# Patient Record
Sex: Male | Born: 1980 | Race: White | Hispanic: No | Marital: Single | Smoking: Current every day smoker
Health system: Southern US, Community
[De-identification: ages and names within clinical notes are randomized; demographics above are authoritative.]

## PROBLEM LIST (undated history)

## (undated) ENCOUNTER — Emergency Department (HOSPITAL_COMMUNITY): Payer: BLUE CROSS/BLUE SHIELD | Source: Home / Self Care

## (undated) DIAGNOSIS — J302 Other seasonal allergic rhinitis: Secondary | ICD-10-CM

---

## 2007-12-14 ENCOUNTER — Emergency Department (HOSPITAL_COMMUNITY): Admission: EM | Admit: 2007-12-14 | Discharge: 2007-12-14 | Payer: Self-pay | Admitting: Emergency Medicine

## 2009-03-24 ENCOUNTER — Emergency Department (HOSPITAL_COMMUNITY): Admission: EM | Admit: 2009-03-24 | Discharge: 2009-03-24 | Payer: Self-pay | Admitting: Emergency Medicine

## 2009-08-25 ENCOUNTER — Emergency Department (HOSPITAL_COMMUNITY): Admission: EM | Admit: 2009-08-25 | Discharge: 2009-08-25 | Payer: Self-pay | Admitting: Emergency Medicine

## 2010-08-15 ENCOUNTER — Encounter: Payer: Self-pay | Admitting: Orthopedic Surgery

## 2010-08-15 ENCOUNTER — Emergency Department (HOSPITAL_COMMUNITY): Admission: EM | Admit: 2010-08-15 | Discharge: 2010-08-15 | Payer: Self-pay | Admitting: Emergency Medicine

## 2010-08-20 ENCOUNTER — Ambulatory Visit: Payer: Self-pay | Admitting: Orthopedic Surgery

## 2010-08-20 DIAGNOSIS — M24469 Recurrent dislocation, unspecified knee: Secondary | ICD-10-CM

## 2010-08-20 DIAGNOSIS — S83006A Unspecified dislocation of unspecified patella, initial encounter: Secondary | ICD-10-CM

## 2010-09-10 ENCOUNTER — Ambulatory Visit: Payer: Self-pay | Admitting: Orthopedic Surgery

## 2010-09-10 ENCOUNTER — Encounter (INDEPENDENT_AMBULATORY_CARE_PROVIDER_SITE_OTHER): Payer: Self-pay | Admitting: *Deleted

## 2010-09-14 ENCOUNTER — Inpatient Hospital Stay (HOSPITAL_COMMUNITY): Admission: EM | Admit: 2010-09-14 | Discharge: 2010-09-19 | Payer: Self-pay | Admitting: Emergency Medicine

## 2010-10-08 ENCOUNTER — Emergency Department (HOSPITAL_COMMUNITY): Admission: EM | Admit: 2010-10-08 | Discharge: 2010-09-13 | Payer: Self-pay | Admitting: Emergency Medicine

## 2010-12-01 NOTE — Assessment & Plan Note (Signed)
Summary: ER/lt knee pain/xrays ap/aware to pay $100/frs   Vital Signs:  Patient profile:   30 year old male Height:      72 inches Weight:      196 pounds Pulse rate:   80 / minute Resp:     18 per minute  Vitals Entered By: Fuller Canada MD (August 20, 2010 9:29 AM)  Visit Type:  new patient Referring Provider:  ap er Primary Provider:  Dr. Renard Matter  CC:  left knee pain.  History of Present Illness: I saw Mason Green in the office today for an initial visit.  He is a 30 years old man with the complaint KG:MWNU knee pain.  DOI 08/14/10 was moving a box and knee dislocated, he put back in place.  Had 1 dislocation beforr this  12 yrs ago playing football, the knee went back in place itself.  Xrays [facility] 08/15/10 left knee.  Meds: Xanax, Lortab7.5/500 1 by mouth q 4 hrs as needed pain number 24 given from er, Dexamethasone 4mg , number 7 given from er.  This 30 her old male complains of sharp dull throbbing stabbing pain in his LEFT knee which started on October 14 after moving a large box.  He said his knee Dislocated and he put it back in place.  This is a second traumatic dislocation for this patient.  12 years ago during a football game he had the same situation and it was reduced on the field they were impacted playing later this season  He complains of 6/10 constant pain.  His symptoms are improved ice worse with walking.  He did have some symptoms of numbness and tingling with swelling.  He is currently on Lortab 7.5 with dexamethasone which he received from the ER along with an Ace wrap and a knee immobilizer.        Allergies (verified): 1)  ! Jonne Ply  Past History:  Past Medical History: anxiety  Past Surgical History: wisdom teeth  Family History: FH of Cancer:  Family History of Diabetes  Social History: Patient is single.  Patient is divorced.  smokes 1ppd cigs no alcohol no caffeine 11th grade ed.  Review of Systems Constitutional:  Denies weight  loss, weight gain, fever, chills, and fatigue. Cardiovascular:  Denies chest pain, palpitations, fainting, and murmurs. Respiratory:  Denies short of breath, wheezing, couch, tightness, pain on inspiration, and snoring . Gastrointestinal:  Denies heartburn, nausea, vomiting, diarrhea, constipation, and blood in your stools. Genitourinary:  Denies frequency, urgency, difficulty urinating, painful urination, flank pain, and bleeding in urine. Neurologic:  Complains of numbness and tingling; denies unsteady gait, dizziness, tremors, and seizure. Musculoskeletal:  Complains of joint pain, swelling, stiffness, and muscle pain; denies instability, redness, and heat. Endocrine:  Denies excessive thirst, exessive urination, and heat or cold intolerance. Psychiatric:  Denies nervousness, depression, anxiety, and hallucinations. Skin:  Denies changes in the skin, poor healing, rash, itching, and redness. HEENT:  Denies blurred or double vision, eye pain, redness, and watering. Immunology:  Complains of seasonal allergies; denies sinus problems and allergic to bee stings. Hemoatologic:  Denies easy bleeding and brusing.  Physical Exam  Additional Exam:  GEN: well developed, well nourished, normal grooming and hygiene, no deformity and normal body habitus.   CDV: pulses are normal, no edema, no erythema. no tenderness  Lymph: normal lymph nodes   Skin: no rashes, skin lesions or open sores   NEURO: normal coordination, reflexes, sensation.   Psyche: awake, alert and oriented. Mood normal  Gait:  is abnormal with a limp crutches and a brace  Chest tenderness on the medial side of the knee in the peripatellar structures and medial patellofemoral ligament.  He has limited range of motion of 30.  His motor exam is deferred but has good muscle tone  His patella is stable but he has apprehension his ACL was intact PCL is intact his collateral ligaments are stable      Impression &  Recommendations:  Problem # 1:  PATELLAR DISLOCATION, LEFT (ICD-836.3) Assessment New  x-rays are recorded on the report and reviewed and I agree with the report small tiny avulsion possibly from the lateral femoral condyle does not appear to be a loose body  Recommendation for 3 weeks  Return for reevaluation and we will order a brace for him when he comes back he'll have to pick it up from the [place] [facility] because he has Medicaid or he is uninsured.  Orders: New Patient Level III (16109)  Patient Instructions: 1)  Wear brace for 3 weeks, only take off when bathing 2)  Return in 3 weeks  3)  walk as tolerated    Orders Added: 1)  New Patient Level III [60454]

## 2010-12-01 NOTE — Letter (Signed)
Summary: History form  History form   Imported By: Jacklynn Ganong 08/26/2010 15:05:41  _____________________________________________________________________  External Attachment:    Type:   Image     Comment:   External Document

## 2010-12-01 NOTE — Letter (Signed)
Summary: Out of Work  [facility]  [address]  [place], [place] [address]   Phone: [phone]  Fax: [phone]    September 10, 2010   Employee:  GOBLE FUDALA Racey    To Whom It May Concern:   For Medical reasons, please excuse the above named employee from work for the following dates:  Start:   08/20/2010  End:/   Cleared to return to work full duty no restrictions 09/10/2010    If you need additional information, please feel free to contact our office.         Sincerely,    Terrance Mass, MD

## 2010-12-01 NOTE — Assessment & Plan Note (Signed)
Summary: 3 WK RE-CK LT KNEE/SELF PAY/?[facility] DISCOUNT/CAF   Visit Type:  Follow-up Referring Provider:  ap er Primary Provider:  Dr. Renard Matter  CC:  left knee pain.  History of Present Illness: QI:[id number] LEFT patella subluxation/dislocation.  Treatment bracing.  Medication Lortab 7.5/500 one q.4 p.r.n. for pain. Meds: Xanax, Lortab7.5/500 1 by mouth q 4 hrs as needed pain number 24 given from er, Dexamethasone 4mg , number 7 given from er.  Complaints popping some stiffness. he also has some pain at night when his knee straight.  Patient brought himself a different brace with a hinge and an open patella.  In today for followup.  DOI 08/14/10 was moving a box and knee dislocated, he put back in place.  Had 1 dislocation before this  12 yrs ago playing football, the knee went back in place itself.  Xrays [facility] 08/15/10 left knee.   Allergies: 1)  ! Asa   Knee Exam  General:    Well-developed, well-nourished, normal body habitus; no deformities, normal grooming.  Gait:    Normal heel-toe gait pattern bilaterally.    Skin:    Intact, no scars, lesions, rashes, cafe au lait spots, or bruising.    Vascular:    There was no swelling or varicose veins. The pulses and temperature are normal. There was no edema or tenderness.  Sensory:    Gross coordination and sensation were normal.    Motor:    he does have some mild extension, weakness, minimal lag.  Knee Exam:    Left:    Inspection/Palpation:  patellofemoral joint is stable at 20, flexion. Minimal apprehension. Range of motion is returned to normal and no joint effusion   Impression & Recommendations:  Problem # 1:  PATELLAR DISLOCATION, LEFT (ICD-836.3) Assessment Improved  Orders: Est. Patient Level III (13244)  Problem # 2:  SUBLUXATION PATELLAR (MALALIGNMENT) (ICD-718.36) Assessment: Improved  Orders: Est. Patient Level III (01027)  Medications Added to Medication List This Visit: 1)  Norco 5-325 Mg  Tabs (Hydrocodone-acetaminophen) .Marland Kitchen.. 1 by mouth q 6 as needed pain  Patient Instructions: 1)  Wear brace for 6 weeks. 2)  Do exercises for 6 weeks  3)  Please schedule a follow-up appointment as needed. Prescriptions: NORCO 5-325 MG TABS (HYDROCODONE-ACETAMINOPHEN) 1 by mouth q 6 as needed pain  #56 x 1   Entered and Authorized by:   Fuller Canada MD   Signed by:   Fuller Canada MD on 09/10/2010   Method used:   Print then Give to Patient   RxID:   [id number]    Orders Added: 1)  Est. Patient Level III [63875]

## 2011-01-12 LAB — DIFFERENTIAL
Basophils Absolute: 0 10*3/uL (ref 0.0–0.1)
Basophils Absolute: 0 10*3/uL (ref 0.0–0.1)
Basophils Relative: 0 % (ref 0–1)
Basophils Relative: 0 % (ref 0–1)
Basophils Relative: 0 % (ref 0–1)
Basophils Relative: 0 % (ref 0–1)
Eosinophils Absolute: 0.3 10*3/uL (ref 0.0–0.7)
Eosinophils Absolute: 0.5 10*3/uL (ref 0.0–0.7)
Eosinophils Absolute: 0.6 10*3/uL (ref 0.0–0.7)
Eosinophils Relative: 2 % (ref 0–5)
Eosinophils Relative: 2 % (ref 0–5)
Eosinophils Relative: 3 % (ref 0–5)
Lymphocytes Relative: 10 % — ABNORMAL LOW (ref 12–46)
Lymphocytes Relative: 11 % — ABNORMAL LOW (ref 12–46)
Lymphocytes Relative: 8 % — ABNORMAL LOW (ref 12–46)
Lymphs Abs: 2 10*3/uL (ref 0.7–4.0)
Lymphs Abs: 2.2 10*3/uL (ref 0.7–4.0)
Lymphs Abs: 2.8 10*3/uL (ref 0.7–4.0)
Monocytes Absolute: 2 10*3/uL — ABNORMAL HIGH (ref 0.1–1.0)
Monocytes Absolute: 2.1 10*3/uL — ABNORMAL HIGH (ref 0.1–1.0)
Monocytes Relative: 10 % (ref 3–12)
Monocytes Relative: 4 % (ref 3–12)
Neutro Abs: 16.1 10*3/uL — ABNORMAL HIGH (ref 1.7–7.7)
Neutro Abs: 21.1 10*3/uL — ABNORMAL HIGH (ref 1.7–7.7)
Neutrophils Relative %: 76 % (ref 43–77)
Neutrophils Relative %: 82 % — ABNORMAL HIGH (ref 43–77)
Neutrophils Relative %: 83 % — ABNORMAL HIGH (ref 43–77)
Neutrophils Relative %: 84 % — ABNORMAL HIGH (ref 43–77)

## 2011-01-12 LAB — CBC
HCT: 35.5 % — ABNORMAL LOW (ref 39.0–52.0)
HCT: 36.4 % — ABNORMAL LOW (ref 39.0–52.0)
HCT: 42.1 % (ref 39.0–52.0)
Hemoglobin: 12 g/dL — ABNORMAL LOW (ref 13.0–17.0)
Hemoglobin: 12.8 g/dL — ABNORMAL LOW (ref 13.0–17.0)
Hemoglobin: 14.5 g/dL (ref 13.0–17.0)
MCH: 31 pg (ref 26.0–34.0)
MCH: 31.3 pg (ref 26.0–34.0)
MCH: 31.8 pg (ref 26.0–34.0)
MCH: 32 pg (ref 26.0–34.0)
MCHC: 33.1 g/dL (ref 30.0–36.0)
MCHC: 33.8 g/dL (ref 30.0–36.0)
MCHC: 34.5 g/dL (ref 30.0–36.0)
MCV: 92.8 fL (ref 78.0–100.0)
MCV: 93 fL (ref 78.0–100.0)
MCV: 93.7 fL (ref 78.0–100.0)
Platelets: 280 10*3/uL (ref 150–400)
Platelets: 387 10*3/uL (ref 150–400)
RBC: 3.34 MIL/uL — ABNORMAL LOW (ref 4.22–5.81)
RDW: 14.4 % (ref 11.5–15.5)
RDW: 14.7 % (ref 11.5–15.5)
RDW: 15 % (ref 11.5–15.5)
RDW: 15.1 % (ref 11.5–15.5)
RDW: 15.2 % (ref 11.5–15.5)
WBC: 18.3 10*3/uL — ABNORMAL HIGH (ref 4.0–10.5)
WBC: 21 10*3/uL — ABNORMAL HIGH (ref 4.0–10.5)
WBC: 28 10*3/uL — ABNORMAL HIGH (ref 4.0–10.5)

## 2011-01-12 LAB — COMPREHENSIVE METABOLIC PANEL
ALT: 17 U/L (ref 0–53)
ALT: 30 U/L (ref 0–53)
AST: 13 U/L (ref 0–37)
AST: 15 U/L (ref 0–37)
Albumin: 2.7 g/dL — ABNORMAL LOW (ref 3.5–5.2)
Alkaline Phosphatase: 72 U/L (ref 39–117)
BUN: 7 mg/dL (ref 6–23)
CO2: 24 mEq/L (ref 19–32)
CO2: 28 mEq/L (ref 19–32)
Calcium: 8.8 mg/dL (ref 8.4–10.5)
Calcium: 8.8 mg/dL (ref 8.4–10.5)
Creatinine, Ser: 0.89 mg/dL (ref 0.4–1.5)
Creatinine, Ser: 1.14 mg/dL (ref 0.4–1.5)
GFR calc Af Amer: >60 mL/min (ref >60)
GFR calc Af Amer: >60 mL/min (ref >60)
GFR calc non Af Amer: >60 mL/min (ref >60)
GFR calc non Af Amer: >60 mL/min (ref >60)
Glucose, Bld: 135 mg/dL — ABNORMAL HIGH (ref 70–99)
Glucose, Bld: 94 mg/dL (ref 70–99)
Potassium: 3.9 mEq/L (ref 3.5–5.1)
Sodium: 139 mEq/L (ref 135–145)
Total Protein: 6.4 g/dL (ref 6.0–8.3)
Total Protein: 7.4 g/dL (ref 6.0–8.3)

## 2011-01-12 LAB — BASIC METABOLIC PANEL
CO2: 26 mEq/L (ref 19–32)
Chloride: 102 mEq/L (ref 96–112)
Creatinine, Ser: 0.98 mg/dL (ref 0.4–1.5)
GFR calc Af Amer: >60 mL/min (ref >60)
GFR calc non Af Amer: >60 mL/min (ref >60)
Glucose, Bld: 93 mg/dL (ref 70–99)
Potassium: 3.9 mEq/L (ref 3.5–5.1)
Sodium: 138 mEq/L (ref 135–145)

## 2011-01-12 LAB — ANAEROBIC CULTURE

## 2011-01-12 LAB — CULTURE, BLOOD (ROUTINE X 2)

## 2011-01-12 LAB — WOUND CULTURE

## 2011-08-05 IMAGING — CT CT EXTREM LOW W/ CM*R*
3 of 5 series · 13 of 33 positions shown, 15 images · IV contrast (Omnipaque 300)
Comparison: None

CLINICAL DATA: Right anterior thigh infection with erythema and
swelling from the inguinal groove down to the knee.

CT OF THE RIGHT THIGH WITH CONTRAST
TECHNIQUE: Multidetector CT imaging of the right thigh was
performed according to the standard protocol following intravenous
contrast administration. Multiplanar CT image reconstructions were
also generated.
Contrast: 80 ml [id number]-VLL

[Series 4: extremity soft tissue 3.0 b30s · axial · 0.74mm/px · z∈[+142,+511]mm · 8 of 147 slices shown, 10 images]
[im 12/147  soft-tissue]
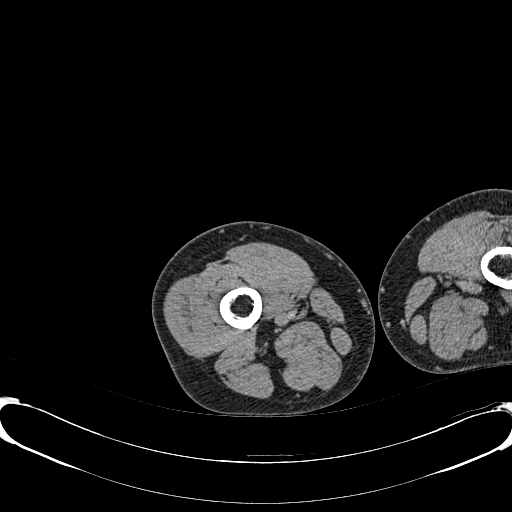
[im 12/147  bone]
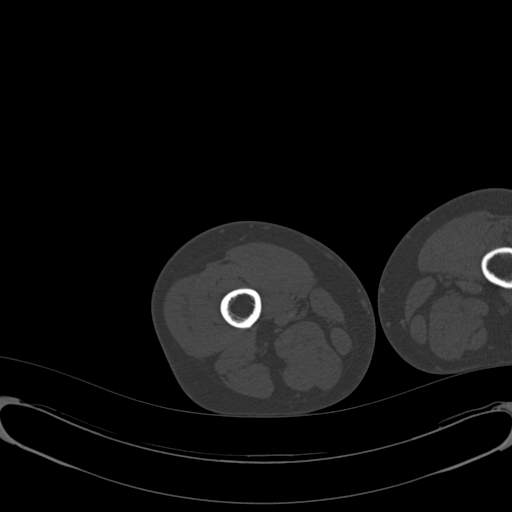
[im 34/147  bone]
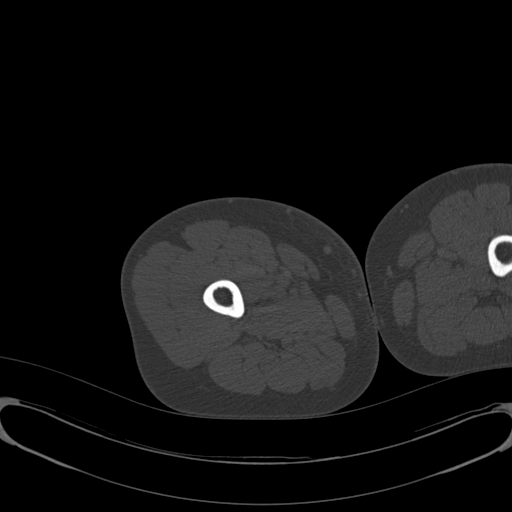
[im 45/147  bone]
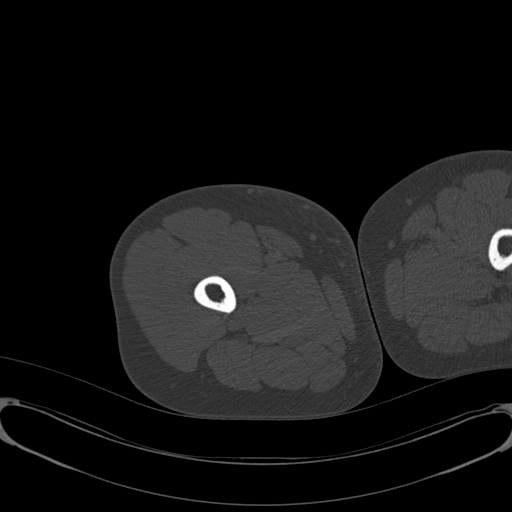
[im 68/147  bone]
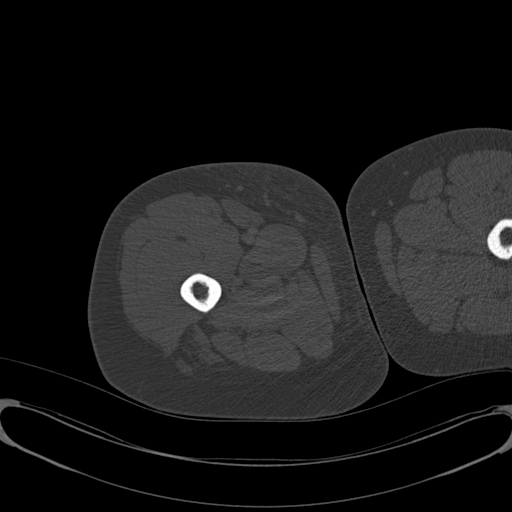
[im 79/147  soft-tissue]
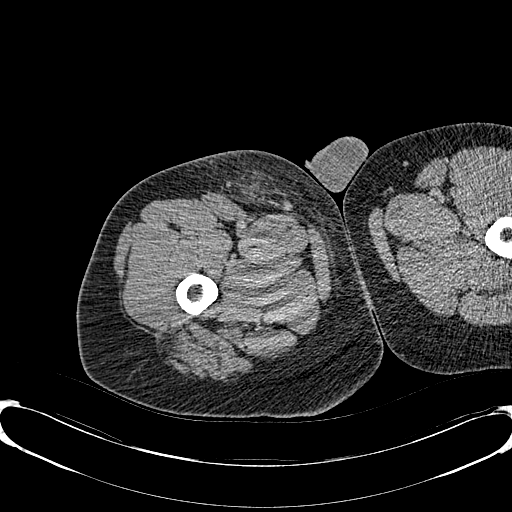
[im 79/147  bone]
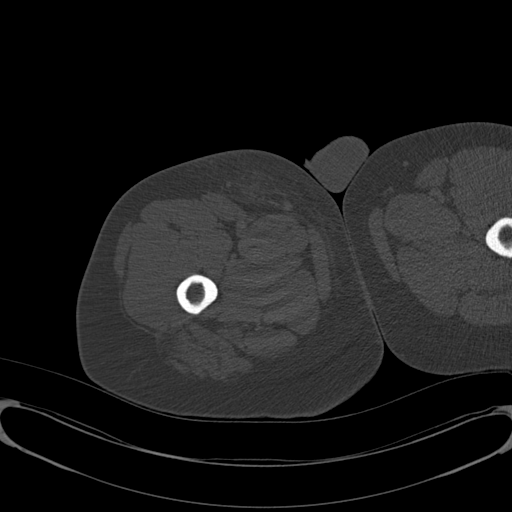
[im 102/147  bone]
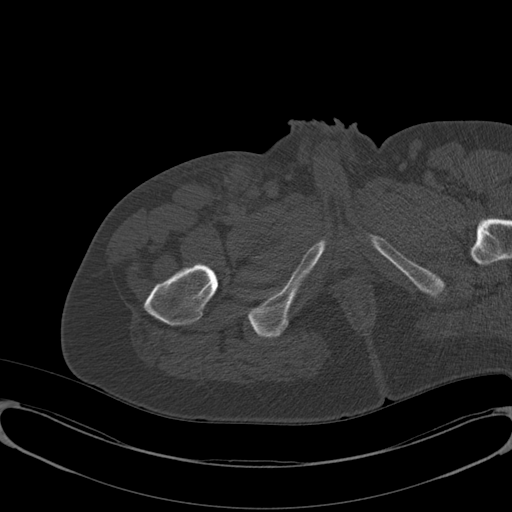
[im 113/147  bone]
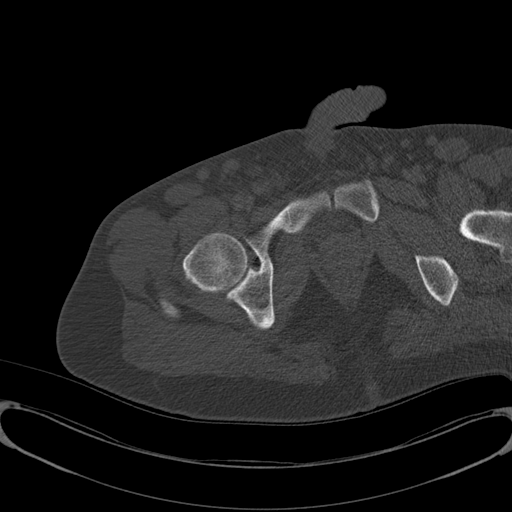
[im 135/147  bone]
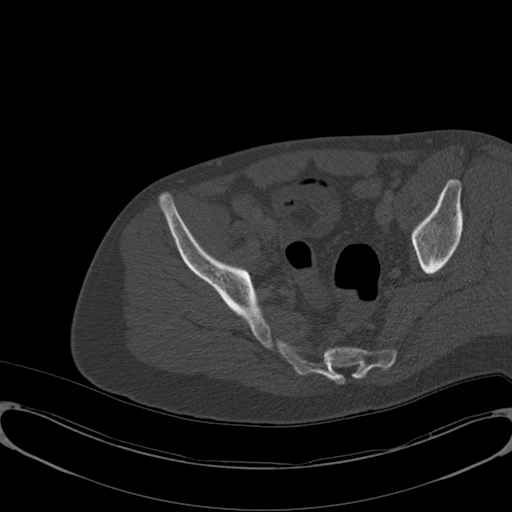

[Series 5: extremity 3.0 bone cor · coronal · 0.63mm/px · 1 of 84 slices shown]
[im 42/84  bone]
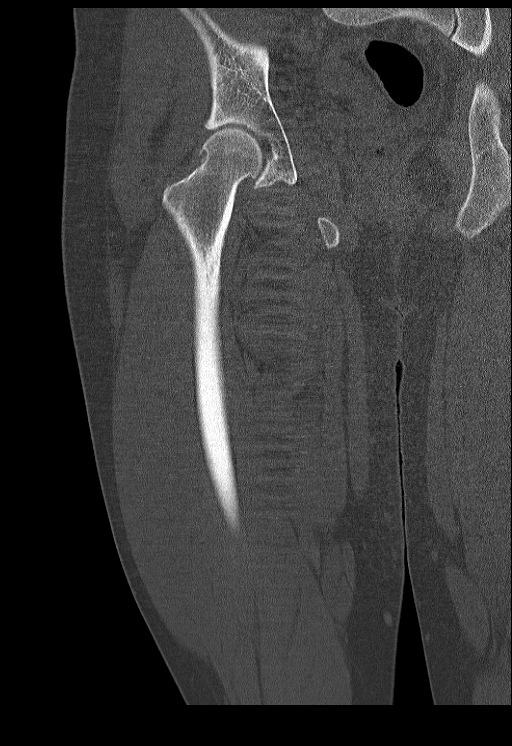

[Series 6: extremity 3.0 bone sag · sagittal · 0.53mm/px · 4 of 84 slices shown]
[im 17/84  bone]
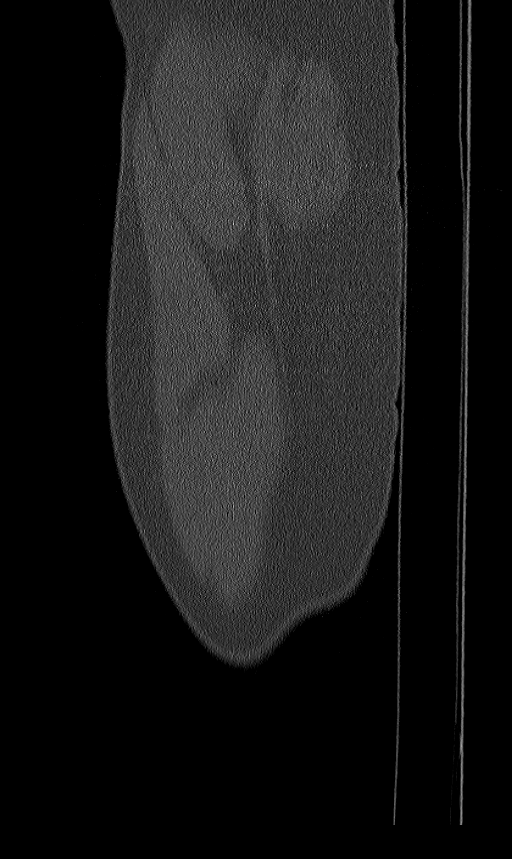
[im 34/84  bone]
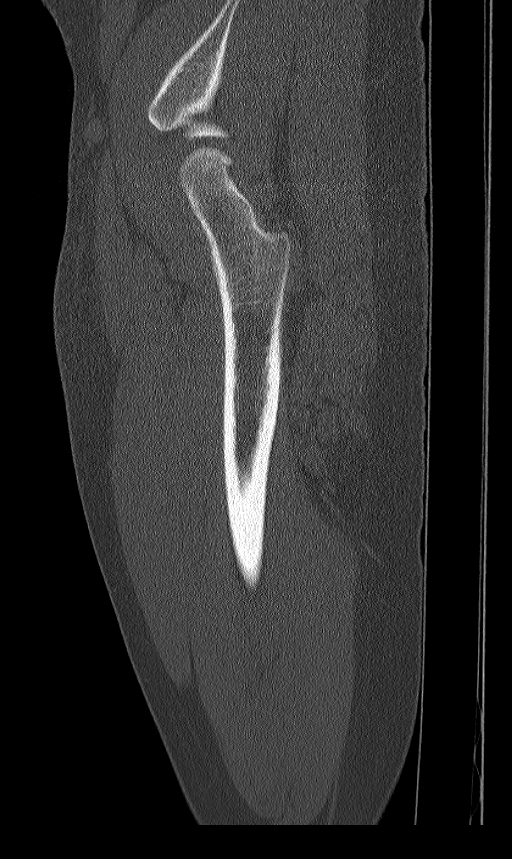
[im 50/84  bone]
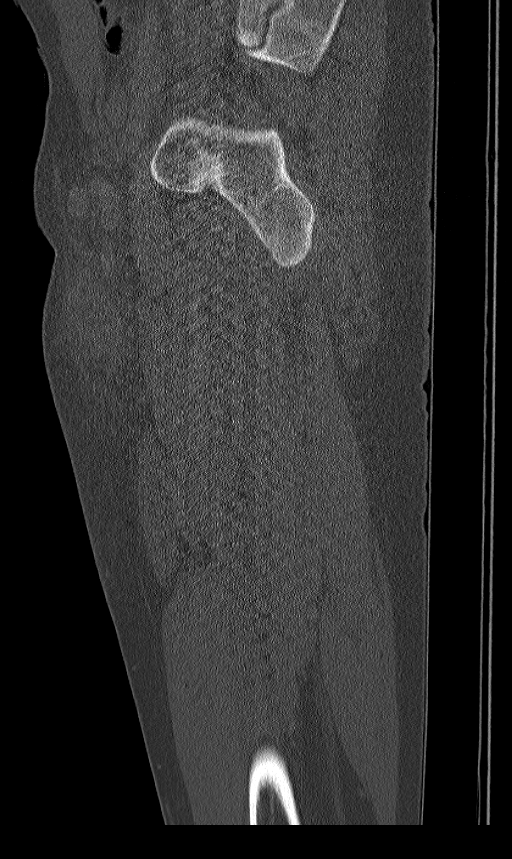
[im 67/84  bone]
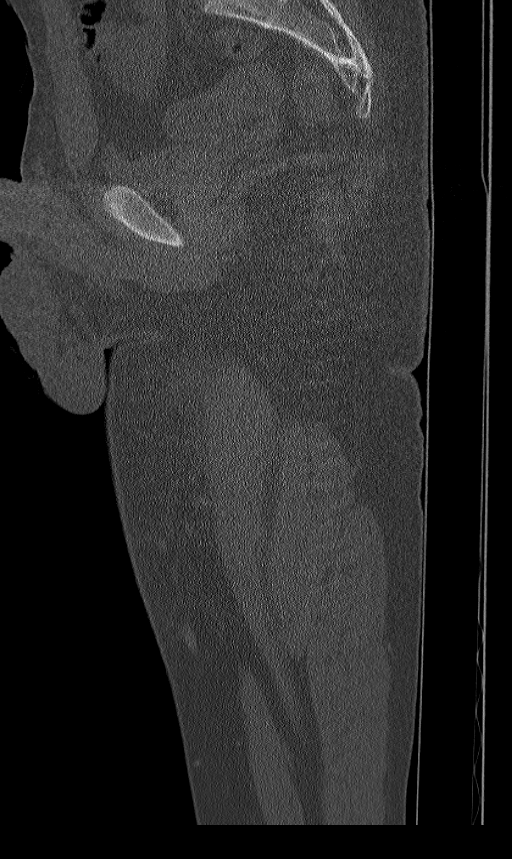

[13 of 33 positions shown; findings below may reference images not displayed]

FINDINGS: Mildly enlarged pelvic sidewall nodes are present.  A
right external iliac node measures 1.5 cm in short axis dimension
on image 22 of series 4.  A separate right external iliac node
measures 1.5 cm in short axis dimension on image 18 of series 4.

On image 59 of series 4, in the inguinal region on the right side,
a 4.0 x 3.3 x 6.0 cm enhancing lesion with some low density
portions probably represents an enlarged and inflamed inguinal
lymph node, and less likely a localized inflammatory phlegmon.
This currently does not represent a drainable abscess.

There is some mild stranding in the subcutaneous tissues around
this lesion and extending into the medial right thigh inferiorly,
although the degree of stranding is relatively mild.

No discernible inflammation of the regional muscle groups
identified.  The neurovascular structures in the area appear
unremarkable.

No findings of hip effusion or osteomyelitis.
IMPRESSION: 1.  Enhancing  structure in the right inguinal region most
compatible with enlarged and inflamed right inguinal lymph node,
with some low density portions suggesting areas of mild necrosis.
This most likely relates to an infectious process such as cat
scratch disease.  No drainable abscess is identified.  Inflammatory
phlegmon which does not include a lymph node is a differential
diagnostic consideration.  Enlarged right external iliac nodes are
present.
2.  No muscle group involvement or bony involvement is identified.
No hip effusion.  Inflammation from the right inguinal process
manifests as stranding which extends along the medial right thigh.
3.  If the patient has diabetes, this should be followed very
closely and treated more aggressively.

## 2013-04-10 ENCOUNTER — Emergency Department (HOSPITAL_COMMUNITY)
Admission: EM | Admit: 2013-04-10 | Discharge: 2013-04-10 | Disposition: A | Discharge status: Home / Self Care | Payer: Self-pay | Attending: Emergency Medicine | Admitting: Emergency Medicine

## 2013-04-10 ENCOUNTER — Emergency Department (HOSPITAL_COMMUNITY): Payer: Self-pay

## 2013-04-10 ENCOUNTER — Encounter (HOSPITAL_COMMUNITY): Payer: Self-pay | Admitting: *Deleted

## 2013-04-10 DIAGNOSIS — F17200 Nicotine dependence, unspecified, uncomplicated: Secondary | ICD-10-CM | POA: Insufficient documentation

## 2013-04-10 DIAGNOSIS — Y9389 Activity, other specified: Secondary | ICD-10-CM | POA: Insufficient documentation

## 2013-04-10 DIAGNOSIS — Y929 Unspecified place or not applicable: Secondary | ICD-10-CM | POA: Insufficient documentation

## 2013-04-10 DIAGNOSIS — X503XXA Overexertion from repetitive movements, initial encounter: Secondary | ICD-10-CM | POA: Insufficient documentation

## 2013-04-10 DIAGNOSIS — S20211A Contusion of right front wall of thorax, initial encounter: Secondary | ICD-10-CM

## 2013-04-10 DIAGNOSIS — S20219A Contusion of unspecified front wall of thorax, initial encounter: Secondary | ICD-10-CM | POA: Insufficient documentation

## 2013-04-10 MED ORDER — METHOCARBAMOL 750 MG PO TABS: 750.0000 mg | ORAL_TABLET | Freq: Four times a day (QID) | ORAL | Status: DC

## 2013-04-10 MED ORDER — OXYCODONE-ACETAMINOPHEN 5-325 MG PO TABS
2.0000 | ORAL_TABLET | Freq: Once | ORAL | Status: AC
  Administered 2013-04-10: 2 via ORAL
  Filled 2013-04-10: qty 2

## 2013-04-10 MED ORDER — OXYCODONE-ACETAMINOPHEN 5-325 MG PO TABS: 2.0000 | ORAL_TABLET | ORAL | Status: DC | PRN

## 2013-04-10 NOTE — ED Notes (Signed)
Pt states was lifting weight yesterday & felt something pop like a rubber band, states hurting in chest w/ movement.

## 2013-04-10 NOTE — ED Notes (Signed)
Pt alert & oriented x4, stable gait. Patient given discharge instructions, paperwork & prescription(s). Patient  instructed to stop at the registration desk to finish any additional paperwork. Patient verbalized understanding. Pt left department w/ no further questions. 

## 2013-04-10 NOTE — ED Provider Notes (Signed)
History     CSN: [id number]  Arrival date & time 04/10/13  2028   First MD Initiated Contact with Patient 04/10/13 2038      Chief Complaint  Patient presents with  . Chest Injury    (Consider location/radiation/quality/duration/timing/severity/associated sxs/prior treatment) The history is provided by the patient.   Patient here complaining of right-sided chest pain started when he was lifting weights. He was expressing it up is that pain is lateral right pectoralis muscle. Pain characterized as sharp and worse to take a deep breath. He denies any dyspnea. Took aspirin with some relief. Pain is better with rest and not moving History reviewed. No pertinent past medical history.  History reviewed. No pertinent past surgical history.  No family history on file.  History  Substance Use Topics  . Smoking status: Current Every Day Smoker    Types: Cigarettes  . Smokeless tobacco: Not on file  . Alcohol Use: No      Review of Systems  All other systems reviewed and are negative.    Allergies  Aspirin  Home Medications  No current outpatient prescriptions on file.  BP 137/77  Pulse 70  Temp(Src) 98.2 F (36.8 C) (Oral)  Resp 18  Ht 6' (1.829 m)  Wt 195 lb (88.451 kg)  BMI 26.44 kg/m2  SpO2 98%  Physical Exam  Nursing note and vitals reviewed. Constitutional: He is oriented to person, place, and time. He appears well-developed and well-nourished.  Non-toxic appearance. No distress.  HENT:  Head: Normocephalic and atraumatic.  Eyes: Conjunctivae, EOM and lids are normal. Pupils are equal, round, and reactive to light.  Neck: Normal range of motion. Neck supple. No tracheal deviation present. No mass present.  Cardiovascular: Normal rate, regular rhythm and normal heart sounds.  Exam reveals no gallop.   No murmur heard. Pulmonary/Chest: Effort normal and breath sounds normal. No stridor. No respiratory distress. He has no decreased breath sounds. He has no  wheezes. He has no rhonchi. He has no rales.    Abdominal: Soft. Normal appearance and bowel sounds are normal. He exhibits no distension. There is no tenderness. There is no rebound and no CVA tenderness.  Musculoskeletal: Normal range of motion. He exhibits no edema and no tenderness.  Neurological: He is alert and oriented to person, place, and time. He has normal strength. No cranial nerve deficit or sensory deficit. GCS eye subscore is 4. GCS verbal subscore is 5. GCS motor subscore is 6.  Skin: Skin is warm and dry. No abrasion and no rash noted.  Psychiatric: He has a normal mood and affect. His speech is normal and behavior is normal.    ED Course  Procedures (including critical care time)  Labs Reviewed - No data to display No results found.   No diagnosis found.    MDM  Pt with negaive cxr and likely chest wall muscle injury--given percocet here and pt feels better        Toy Baker, MD 04/12/13 1321

## 2014-03-02 IMAGING — CR DG CHEST 2V
2 series · 2 of 2 positions shown · non-contrast
Comparison: 12/14/2007

CLINICAL DATA: Acute right chest pain while lifting weights.

CHEST - 2 VIEW

[view not recorded (1 of 2)]
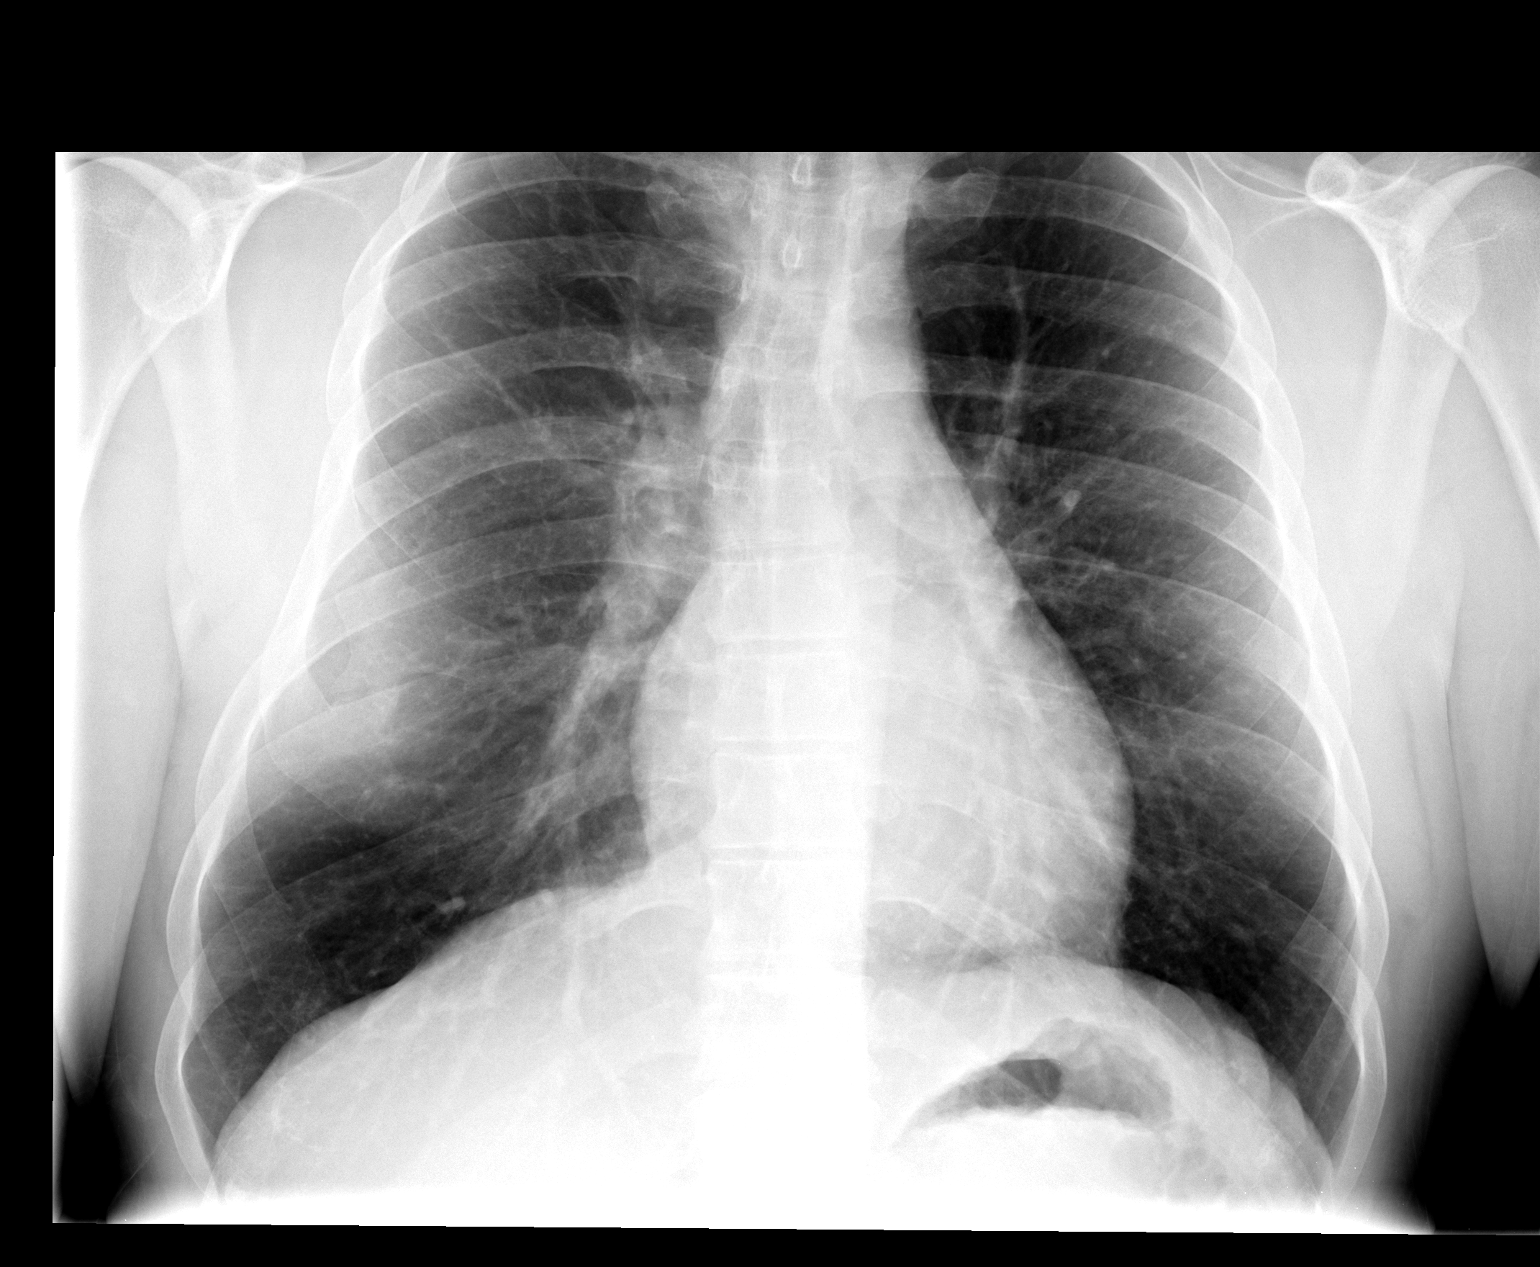

[view not recorded (2 of 2)]
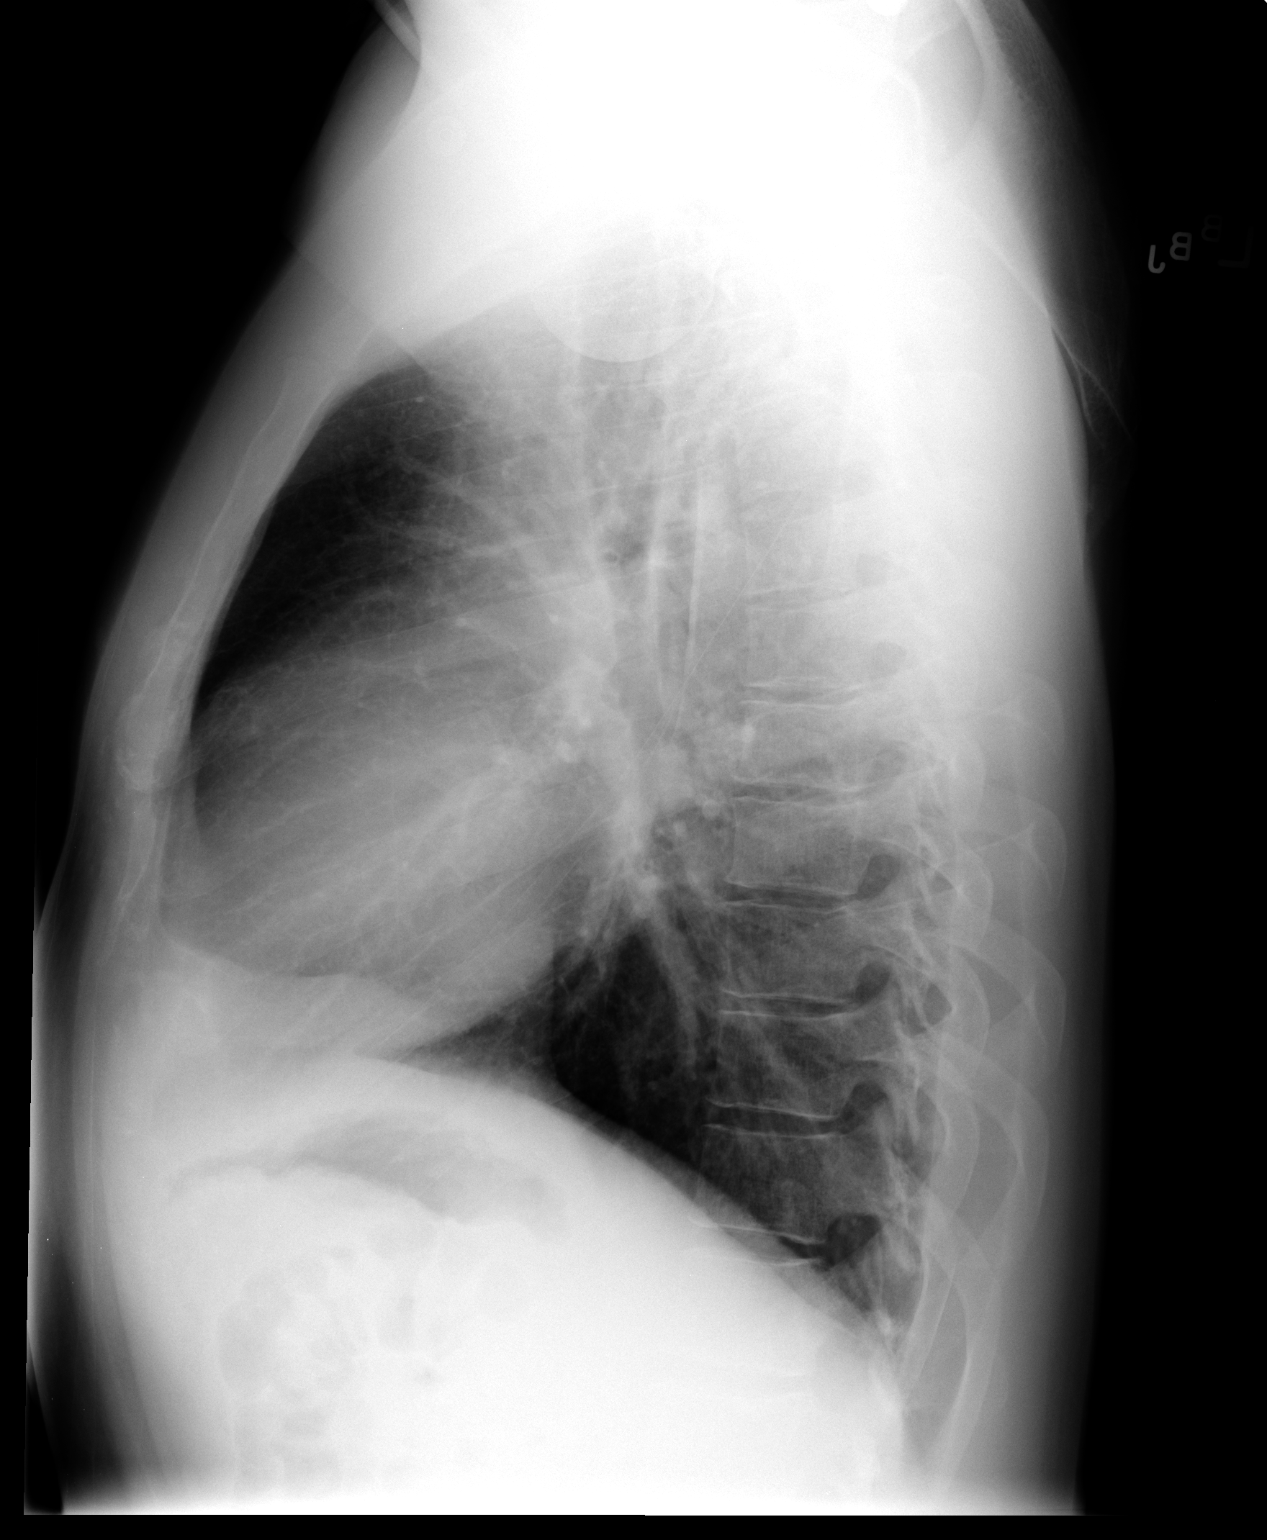

[2 of 2 positions shown; findings below may reference images not displayed]

FINDINGS: There is no evidence of pneumothorax, pulmonary
consolidation or pleural fluid.  The cardiac and mediastinal
contours within normal limits.  No acute fractures are identified.
There is stable deformity of the right sixth rib which appears to
partially bridge with the seventh rib anteriorly.  This may be
secondary to prior trauma, surgery or a congenital abnormality.
IMPRESSION: No acute findings.  Stable right sixth rib deformity.

## 2015-05-20 ENCOUNTER — Emergency Department (HOSPITAL_COMMUNITY)
Admission: EM | Admit: 2015-05-20 | Discharge: 2015-05-20 | Disposition: A | Discharge status: Home / Self Care | Payer: BLUE CROSS/BLUE SHIELD | Attending: Emergency Medicine | Admitting: Emergency Medicine

## 2015-05-20 ENCOUNTER — Encounter (HOSPITAL_COMMUNITY): Payer: Self-pay | Admitting: Emergency Medicine

## 2015-05-20 DIAGNOSIS — Y9389 Activity, other specified: Secondary | ICD-10-CM | POA: Diagnosis not present

## 2015-05-20 DIAGNOSIS — T148 Other injury of unspecified body region: Secondary | ICD-10-CM | POA: Insufficient documentation

## 2015-05-20 DIAGNOSIS — Y9289 Other specified places as the place of occurrence of the external cause: Secondary | ICD-10-CM | POA: Insufficient documentation

## 2015-05-20 DIAGNOSIS — B880 Other acariasis: Secondary | ICD-10-CM

## 2015-05-20 DIAGNOSIS — R21 Rash and other nonspecific skin eruption: Secondary | ICD-10-CM | POA: Diagnosis present

## 2015-05-20 DIAGNOSIS — Y998 Other external cause status: Secondary | ICD-10-CM | POA: Diagnosis not present

## 2015-05-20 DIAGNOSIS — Z720 Tobacco use: Secondary | ICD-10-CM | POA: Insufficient documentation

## 2015-05-20 DIAGNOSIS — W57XXXA Bitten or stung by nonvenomous insect and other nonvenomous arthropods, initial encounter: Secondary | ICD-10-CM | POA: Insufficient documentation

## 2015-05-20 MED ORDER — CEPHALEXIN 500 MG PO CAPS: 500.0000 mg | ORAL_CAPSULE | Freq: Four times a day (QID) | ORAL | Status: AC

## 2015-05-20 MED ORDER — HYDROCORTISONE 1 % EX CREA: TOPICAL_CREAM | CUTANEOUS | Status: AC

## 2015-05-20 NOTE — ED Notes (Signed)
Pt states that he believes that he got into some chiggers on Sunday - has bites around both ankles- Concerned that they are getting infected

## 2015-05-20 NOTE — ED Provider Notes (Signed)
CSN: [id number]     Arrival date & time 05/20/15  1700 History   First MD Initiated Contact with Patient 05/20/15 1741     Chief Complaint  Patient presents with  . Insect Bite     (Consider location/radiation/quality/duration/timing/severity/associated sxs/prior Treatment) HPI  The pt had Chigger bites for last 3 days - itching, red, nothing makes better or worse - has tried placing nail polish on this area.  This has not improved his symptoms, there is no fevers chills nausea vomiting or swelling of the ankles.  History reviewed. No pertinent past medical history. History reviewed. No pertinent past surgical history. History reviewed. No pertinent family history. History  Substance Use Topics  . Smoking status: Current Every Day Smoker -- 1.00 packs/day    Types: Cigarettes  . Smokeless tobacco: Never Used  . Alcohol Use: No    Review of Systems  Constitutional: Negative for fever.  Gastrointestinal: Negative for nausea and vomiting.  Skin: Positive for rash.      Allergies  Aspirin and Hydrocodone  Home Medications   Prior to Admission medications   Medication Sig Start Date End Date Taking? Authorizing Provider  tetrahydrozoline-zinc (VISINE-AC) 0.05-0.25 % ophthalmic solution Apply 2 drops to eye 3 (three) times daily as needed (Multi-Action Allergy relief Formula).   Yes Historical Provider, MD  cephALEXin (KEFLEX) 500 MG capsule Take 1 capsule (500 mg total) by mouth 4 (four) times daily. 05/20/15   Eber HongBrian Hannie Shoe, MD  hydrocortisone cream 1 % Apply to affected area 2 times daily 05/20/15   Eber HongBrian Fairy Ashlock, MD   BP 126/78 mmHg  Pulse 71  Temp(Src) 98.2 F (36.8 C) (Oral)  Resp 18  Ht 6' (1.829 m)  Wt 190 lb (86.183 kg)  BMI 25.76 kg/m2  SpO2 100% Physical Exam  Constitutional: He appears well-developed and well-nourished.  HENT:  Head: Normocephalic and atraumatic.  Eyes: Conjunctivae are normal. Right eye exhibits no discharge. Left eye exhibits no discharge.   Pulmonary/Chest: Effort normal. No respiratory distress.  Neurological: He is alert. Coordination normal.  Skin: Skin is warm and dry. Rash noted. He is not diaphoretic. No erythema.  Multiple areas of subcutaneous 4 mm erythema with central ulcerative spots, there are no excoriations, no surrounding cellulitis, no induration, no drainage  Psychiatric: He has a normal mood and affect.  Nursing note and vitals reviewed.   ED Course  Procedures (including critical care time) Labs Review Labs Reviewed - No data to display  Imaging Review No results found.    MDM   Final diagnoses:  Chigger bites    The patient's exam is consistent with his complaint of being bitten by chiggers, vital signs normal, recommended hydrocortisone and Benadryl, patient is in agreement.    Eber HongBrian Jayren Cease, MD 05/20/15 [phone]

## 2015-05-20 NOTE — ED Notes (Signed)
MD at bedside. 

## 2015-08-17 ENCOUNTER — Encounter (HOSPITAL_COMMUNITY): Payer: Self-pay | Admitting: Emergency Medicine

## 2015-08-17 ENCOUNTER — Emergency Department (HOSPITAL_COMMUNITY)
Admission: EM | Admit: 2015-08-17 | Discharge: 2015-08-17 | Disposition: A | Discharge status: Home / Self Care | Payer: BLUE CROSS/BLUE SHIELD | Attending: Emergency Medicine | Admitting: Emergency Medicine

## 2015-08-17 DIAGNOSIS — Z720 Tobacco use: Secondary | ICD-10-CM | POA: Diagnosis not present

## 2015-08-17 DIAGNOSIS — Y998 Other external cause status: Secondary | ICD-10-CM | POA: Diagnosis not present

## 2015-08-17 DIAGNOSIS — Y9289 Other specified places as the place of occurrence of the external cause: Secondary | ICD-10-CM | POA: Diagnosis not present

## 2015-08-17 DIAGNOSIS — S299XXA Unspecified injury of thorax, initial encounter: Secondary | ICD-10-CM | POA: Diagnosis present

## 2015-08-17 DIAGNOSIS — S238XXA Sprain of other specified parts of thorax, initial encounter: Secondary | ICD-10-CM | POA: Diagnosis not present

## 2015-08-17 DIAGNOSIS — X58XXXA Exposure to other specified factors, initial encounter: Secondary | ICD-10-CM | POA: Insufficient documentation

## 2015-08-17 DIAGNOSIS — Y9389 Activity, other specified: Secondary | ICD-10-CM | POA: Diagnosis not present

## 2015-08-17 DIAGNOSIS — Z79899 Other long term (current) drug therapy: Secondary | ICD-10-CM | POA: Diagnosis not present

## 2015-08-17 HISTORY — DX: Other seasonal allergic rhinitis: J30.2

## 2015-08-17 MED ORDER — CYCLOBENZAPRINE HCL 10 MG PO TABS: 10.0000 mg | ORAL_TABLET | Freq: Two times a day (BID) | ORAL | Status: AC | PRN

## 2015-08-17 NOTE — ED Notes (Signed)
Discharge instructions given to pt - discussed OTC pain meds --- ( pt stated can't use NSAID) due to allergy to Asprin . Verbalized understanding and ambulated off unit with wife

## 2015-08-17 NOTE — ED Notes (Signed)
Patient c/o pulled muscle in chest after moving metal bed off of truck. Patient states on right ide of chest. Tender to palpitation with sharp stabbing sensation. Denies any shortness of breath.

## 2015-08-17 NOTE — ED Provider Notes (Signed)
CSN: [id number]     Arrival date & time 08/17/15  1332 History   First MD Initiated Contact with Patient 08/17/15 1346     Chief Complaint  Patient presents with  . Muscle Pain     (Consider location/radiation/quality/duration/timing/severity/associated sxs/prior Treatment) Patient is a 34 y.o. male presenting with musculoskeletal pain. The history is provided by the patient.  Muscle Pain This is a new problem. Associated symptoms include chest pain.   patient states that he was lifting the bed off the back of a truck when he had some sharp pain in his right chest. It is in the muscle. States it felt so there was a tear. Has had pain since. Some worse with movement. No numbness or weakness. He has not had problems with this before.  Past Medical History  Diagnosis Date  . Seasonal allergies    History reviewed. No pertinent past surgical history. History reviewed. No pertinent family history. Social History  Substance Use Topics  . Smoking status: Current Every Day Smoker -- 1.00 packs/day for 12 years    Types: Cigarettes  . Smokeless tobacco: Never Used  . Alcohol Use: No    Review of Systems  Cardiovascular: Positive for chest pain.  Musculoskeletal: Negative for back pain, joint swelling and neck pain.  Skin: Negative for wound.  Hematological: Does not bruise/bleed easily.      Allergies  Aspirin and Hydrocodone  Home Medications   Prior to Admission medications   Medication Sig Start Date End Date Taking? Authorizing Provider  loratadine (CLARITIN) 10 MG tablet Take 10 mg by mouth daily.   Yes Historical Provider, MD  Multiple Vitamin (ONE-A-DAY MENS PO) Take 1 tablet by mouth daily.   Yes Historical Provider, MD  cephALEXin (KEFLEX) 500 MG capsule Take 1 capsule (500 mg total) by mouth 4 (four) times daily. Patient not taking: Reported on 08/17/2015 05/20/15   Eber HongBrian Miller, MD  cyclobenzaprine (FLEXERIL) 10 MG tablet Take 1 tablet (10 mg total) by mouth 2 (two)  times daily as needed for muscle spasms. 08/17/15   Benjiman CoreNathan Tulsi Crossett, MD  hydrocortisone cream 1 % Apply to affected area 2 times daily Patient not taking: Reported on 08/17/2015 05/20/15   Eber HongBrian Miller, MD  tetrahydrozoline-zinc (VISINE-AC) 0.05-0.25 % ophthalmic solution Apply 2 drops to eye 3 (three) times daily as needed (Multi-Action Allergy relief Formula).    Historical Provider, MD   BP 148/96 mmHg  Pulse 79  Temp(Src) 98.6 F (37 C)  Resp 18  Ht 6' (1.829 m)  Wt 200 lb (90.719 kg)  BMI 27.12 kg/m2  SpO2 97% Physical Exam  Constitutional: He appears well-developed.  Pulmonary/Chest:  Some tenderness over the inferior pectoralis muscle on the right side. Maybe some deformity on the inferior lateral aspect. Still good strength. No ecchymosis.    ED Course  Procedures (including critical care time) Labs Review Labs Reviewed - No data to display  Imaging Review No results found. I have personally reviewed and evaluated these images and lab results as part of my medical decision-making.   EKG Interpretation None      MDM   Final diagnoses:  Sprain of chest wall, initial encounter    Patient with sprain versus tear pectoralis muscle. No ecchymosis. Will treat conservatively in a follow-up as needed.    Benjiman CoreNathan Jailynne Opperman, MD 08/17/15 1416

## 2015-08-17 NOTE — Discharge Instructions (Signed)

## 2019-08-14 ENCOUNTER — Other Ambulatory Visit: Payer: Self-pay

## 2019-08-14 DIAGNOSIS — Z20822 Contact with and (suspected) exposure to covid-19: Secondary | ICD-10-CM

## 2019-08-16 LAB — NOVEL CORONAVIRUS, NAA: SARS-CoV-2, NAA: NOT DETECTED

## 2019-08-20 ENCOUNTER — Telehealth: Payer: Self-pay | Admitting: General Practice

## 2019-08-20 NOTE — Telephone Encounter (Signed)
Negative COVID results given. Patient results "NOT Detected." Caller expressed understanding. ° °

## 2023-06-08 DIAGNOSIS — N201 Calculus of ureter: Secondary | ICD-10-CM | POA: Diagnosis not present

## 2023-06-08 DIAGNOSIS — R109 Unspecified abdominal pain: Secondary | ICD-10-CM | POA: Diagnosis not present

## 2023-06-08 DIAGNOSIS — F17210 Nicotine dependence, cigarettes, uncomplicated: Secondary | ICD-10-CM | POA: Diagnosis not present
